# Patient Record
Sex: Female | Born: 1950 | Hispanic: Yes | Marital: Married | Smoking: Never smoker
Health system: Southern US, Community
[De-identification: ages and names within clinical notes are randomized; demographics above are authoritative.]

## PROBLEM LIST (undated history)

## (undated) DIAGNOSIS — I10 Essential (primary) hypertension: Secondary | ICD-10-CM

---

## 2017-09-29 ENCOUNTER — Encounter (HOSPITAL_COMMUNITY): Payer: Self-pay | Admitting: Emergency Medicine

## 2017-09-29 ENCOUNTER — Other Ambulatory Visit: Payer: Self-pay

## 2017-09-29 ENCOUNTER — Emergency Department (HOSPITAL_COMMUNITY)
Admission: EM | Admit: 2017-09-29 | Discharge: 2017-09-29 | Disposition: A | Discharge status: Home / Self Care | Payer: Medicare Other | Attending: Emergency Medicine | Admitting: Emergency Medicine

## 2017-09-29 DIAGNOSIS — L03221 Cellulitis of neck: Secondary | ICD-10-CM | POA: Diagnosis not present

## 2017-09-29 DIAGNOSIS — R21 Rash and other nonspecific skin eruption: Secondary | ICD-10-CM | POA: Diagnosis not present

## 2017-09-29 MED ORDER — DOXYCYCLINE HYCLATE 100 MG PO CAPS: 100.0000 mg | ORAL_CAPSULE | Freq: Two times a day (BID) | ORAL | 0 refills | Status: AC

## 2017-09-29 NOTE — ED Notes (Signed)
ED Provider at bedside. 

## 2017-09-29 NOTE — Discharge Instructions (Signed)
Take doxycycline as prescribed.  You may stop taking the Keflex.  Follow-up with dermatology, see your doctor.

## 2017-09-29 NOTE — ED Triage Notes (Signed)
Translator/pt stated, Im having pain on my neck for a month . I went to a Dr. And they said its shingles and gave me Keflex but I want to have a second opinion.

## 2017-09-29 NOTE — ED Provider Notes (Signed)
[facility] EMERGENCY DEPARTMENT Provider Note   CSN: [id number] Arrival date & time: 09/29/17  1003     History   Chief Complaint No chief complaint on file.   HPI Priscilla Luna is a 67 y.o. female.  67 year old female presents with complaint of rash to both sides of her neck and right ear area.  Interpreter was used for history, physical, discharge planning.  Patient reports rash started 1 month ago on her neck and right ear.  Patient states that she thought she had a wart that she popped and then developed the rash.  Patient went to her PCP last week and was given Keflex and prednisone.  Patient states that Keflex is upsetting her stomach so she is unable to take it.  Patient was told she had shingles however she disagrees with this diagnosis.  Patient states the area is sensitive to the touch otherwise does not itch or bother her.  No other rashes or lesions, no new medications otherwise.  No other complaints or concerns.     History reviewed. No pertinent past medical history.  There are no active problems to display for this patient.   History reviewed. No pertinent surgical history.   OB History   None      Home Medications    Prior to Admission medications   Medication Sig Start Date End Date Taking? Authorizing Provider  doxycycline (VIBRAMYCIN) 100 MG capsule Take 1 capsule (100 mg total) by mouth 2 (two) times daily. 09/29/17   Jeannie Fend, PA-C    Family History No family history on file.  Social History Social History   Tobacco Use  . Smoking status: Never Smoker  . Smokeless tobacco: Never Used  Substance Use Topics  . Alcohol use: Not Currently  . Drug use: Not Currently     Allergies   Patient has no allergy information on record.   Review of Systems Review of Systems  Constitutional: Negative for fever.  Musculoskeletal: Negative for neck pain and neck stiffness.  Skin: Positive for rash.    Allergic/Immunologic: Negative for immunocompromised state.  Neurological: Negative for headaches.  Hematological: Negative for adenopathy. Does not bruise/bleed easily.  Psychiatric/Behavioral: Negative for confusion.  All other systems reviewed and are negative.    Physical Exam Updated Vital Signs BP (!) 171/83 (BP Location: Right Arm)   Pulse 89   Temp 98.1 F (36.7 C) (Oral)   Resp 16   SpO2 98%   Physical Exam  Constitutional: She is oriented to person, place, and time. She appears well-developed and well-nourished. No distress.  HENT:  Head: Normocephalic and atraumatic.    Erythematous patches with raised, scalloped borders to bilateral posterior/lateral neck area. Also involving right posterior ear and scalp. No drainage.   Neck: Neck supple.  Pulmonary/Chest: Effort normal.  Musculoskeletal: She exhibits no tenderness.  Lymphadenopathy:    She has no cervical adenopathy.  Neurological: She is alert and oriented to person, place, and time.  Skin: Skin is warm and dry. Rash noted. She is not diaphoretic.  Psychiatric: She has a normal mood and affect. Her behavior is normal.  Nursing note and vitals reviewed.    ED Treatments / Results  Labs (all labs ordered are listed, but only abnormal results are displayed) Labs Reviewed - No data to display  EKG None  Radiology No results found.  Procedures Procedures (including critical care time)  Medications Ordered in ED Medications - No data to display  Initial Impression / Assessment and Plan / ED Course  I have reviewed the triage vital signs and the nursing notes.  Pertinent labs & imaging results that were available during my care of the patient were reviewed by me and considered in my medical decision making (see chart for details).  Clinical Course as of Sep 29 1129  Mon Sep 29, 2017  [id number] 67 year old female presents with rash to bilateral neck areas as well as right posterior ear onset 1 month  ago.  Patient was seen by her PCP and diagnosed with shingles, given Keflex and prednisone.  Rash does not appear consistent with shingles and its current distribution and now 1 month since onset with its appearance.  Agree with concerns for possible secondary infection, patient is not tolerating the Keflex well recommend she discontinue the Keflex and will place on doxycycline.  Patient requests referral to dermatology, phone number given for her to contact to make an appointment.  Also recommend contact with her PCP if unable to see dermatology.   [LM]    Clinical Course User Index [LM] Jeannie FendMurphy, Navon Kotowski A, PA-C   Final Clinical Impressions(s) / ED Diagnoses   Final diagnoses:  Rash  Cellulitis of neck    ED Discharge Orders        Ordered    doxycycline (VIBRAMYCIN) 100 MG capsule  2 times daily     09/29/17 1119       Alden HippMurphy, Josetta Wigal A, PA-C 09/29/17 1131    Gwyneth SproutPlunkett, Whitney, MD 09/29/17 2038

## 2017-09-29 NOTE — ED Notes (Signed)
Pt verbalized understanding of discharge instructions and denies any further questions at this time.   

## 2017-10-10 ENCOUNTER — Other Ambulatory Visit: Payer: Self-pay

## 2017-10-10 ENCOUNTER — Emergency Department (HOSPITAL_COMMUNITY): Payer: Medicare Other

## 2017-10-10 ENCOUNTER — Emergency Department (HOSPITAL_COMMUNITY)
Admission: EM | Admit: 2017-10-10 | Discharge: 2017-10-11 | Disposition: A | Discharge status: Home / Self Care | Payer: Medicare Other | Attending: Emergency Medicine | Admitting: Emergency Medicine

## 2017-10-10 ENCOUNTER — Encounter (HOSPITAL_COMMUNITY): Payer: Self-pay

## 2017-10-10 DIAGNOSIS — R29810 Facial weakness: Secondary | ICD-10-CM

## 2017-10-10 DIAGNOSIS — I10 Essential (primary) hypertension: Secondary | ICD-10-CM | POA: Diagnosis not present

## 2017-10-10 DIAGNOSIS — G510 Bell's palsy: Secondary | ICD-10-CM | POA: Diagnosis not present

## 2017-10-10 DIAGNOSIS — R531 Weakness: Secondary | ICD-10-CM | POA: Insufficient documentation

## 2017-10-10 HISTORY — DX: Essential (primary) hypertension: I10

## 2017-10-10 LAB — DIFFERENTIAL
BASOS PCT: 1 %
Basophils Absolute: 0.1 10*3/uL (ref 0.0–0.1)
EOS PCT: 5 %
Eosinophils Absolute: 0.3 10*3/uL (ref 0.0–0.7)
Lymphocytes Relative: 31 %
Lymphs Abs: 2.3 10*3/uL (ref 0.7–4.0)
MONO ABS: 0.9 10*3/uL (ref 0.1–1.0)
Monocytes Relative: 12 %
Neutro Abs: 3.8 10*3/uL (ref 1.7–7.7)
Neutrophils Relative %: 51 %

## 2017-10-10 LAB — COMPREHENSIVE METABOLIC PANEL
ALT: 24 U/L (ref 0–44)
ANION GAP: 9 (ref 5–15)
AST: 22 U/L (ref 15–41)
Albumin: 4.1 g/dL (ref 3.5–5.0)
Alkaline Phosphatase: 73 U/L (ref 38–126)
BUN: 14 mg/dL (ref 8–23)
CALCIUM: 9.7 mg/dL (ref 8.9–10.3)
CO2: 27 mmol/L (ref 22–32)
Chloride: 106 mmol/L (ref 98–111)
Creatinine, Ser: 0.78 mg/dL (ref 0.44–1.00)
GFR calc non Af Amer: >60 mL/min (ref >60)
Glucose, Bld: 118 mg/dL — ABNORMAL HIGH (ref 70–99)
Potassium: 4 mmol/L (ref 3.5–5.1)
SODIUM: 142 mmol/L (ref 135–145)
Total Bilirubin: 0.7 mg/dL (ref 0.3–1.2)
Total Protein: 7.6 g/dL (ref 6.5–8.1)

## 2017-10-10 LAB — RAPID URINE DRUG SCREEN, HOSP PERFORMED
Amphetamines: NOT DETECTED
Barbiturates: NOT DETECTED
Benzodiazepines: NOT DETECTED
Cocaine: NOT DETECTED
OPIATES: NOT DETECTED
Tetrahydrocannabinol: NOT DETECTED

## 2017-10-10 LAB — URINALYSIS, ROUTINE W REFLEX MICROSCOPIC
BILIRUBIN URINE: NEGATIVE
Glucose, UA: NEGATIVE mg/dL
Hgb urine dipstick: NEGATIVE
Ketones, ur: NEGATIVE mg/dL
LEUKOCYTES UA: NEGATIVE
NITRITE: NEGATIVE
PH: 6 (ref 5.0–8.0)
Protein, ur: NEGATIVE mg/dL
SPECIFIC GRAVITY, URINE: 1.009 (ref 1.005–1.030)

## 2017-10-10 LAB — I-STAT TROPONIN, ED: Troponin i, poc: 0.01 ng/mL (ref 0.00–0.08)

## 2017-10-10 LAB — I-STAT CHEM 8, ED
BUN: 13 mg/dL (ref 8–23)
CALCIUM ION: 1.17 mmol/L (ref 1.15–1.40)
Chloride: 106 mmol/L (ref 98–111)
Creatinine, Ser: 0.8 mg/dL (ref 0.44–1.00)
GLUCOSE: 113 mg/dL — AB (ref 70–99)
HCT: 41 % (ref 36.0–46.0)
HEMOGLOBIN: 13.9 g/dL (ref 12.0–15.0)
Potassium: 3.9 mmol/L (ref 3.5–5.1)
Sodium: 141 mmol/L (ref 135–145)
TCO2: 26 mmol/L (ref 22–32)

## 2017-10-10 LAB — CBC
HCT: 41 % (ref 36.0–46.0)
Hemoglobin: 13.1 g/dL (ref 12.0–15.0)
MCH: 31.4 pg (ref 26.0–34.0)
MCHC: 32 g/dL (ref 30.0–36.0)
MCV: 98.3 fL (ref 78.0–100.0)
PLATELETS: 261 10*3/uL (ref 150–400)
RBC: 4.17 MIL/uL (ref 3.87–5.11)
RDW: 13 % (ref 11.5–15.5)
WBC: 7.4 10*3/uL (ref 4.0–10.5)

## 2017-10-10 LAB — PROTIME-INR
INR: 1.03
PROTHROMBIN TIME: 13.4 s (ref 11.4–15.2)

## 2017-10-10 LAB — APTT: aPTT: 24 seconds (ref 24–36)

## 2017-10-10 NOTE — ED Notes (Signed)
Report called to [facility] ED Charge RN  

## 2017-10-10 NOTE — ED Notes (Signed)
[facility] picking up patient to take to [facility].

## 2017-10-10 NOTE — ED Triage Notes (Addendum)
Transporter/pt states--face and mouth a little "twisted" Pt reports neck is tense. Denies pain. Pt's son stats the mouth is not normal, it's "twisting to one side." Smile is drooping to on right side, started 3 hours ago. Denies vision changes. Denies chest pain and shortness of breath. Ambulatory. Pt reports hx of HTN, took BP meds today.

## 2017-10-10 NOTE — ED Notes (Signed)
Teleneuro cart activated.  

## 2017-10-10 NOTE — ED Provider Notes (Signed)
Emergency Department Provider Note   I have reviewed the triage vital signs and the nursing notes.   HISTORY  Chief Complaint Code Stroke and Facial Droop   HPI Priscilla Luna is a 67 y.o. female with PMH of HTN and elevated BMI presents to the emergency department for evaluation by private vehicle for right face "twisting" and weakness that began at 7 PM.  The patient's son states that they were having a family meeting when family noticed to the right side weakness.  The patient denies any numbness, vision changes, voice changes.  She does not appreciate any weakness in the arms or legs.  No prior history of stroke.  Symptoms have not improved since 7 PM. Son notes that BP has been higher than normal as well. Patient denies any CP, SOB, or palpitations.    Past Medical History:  Diagnosis Date  . Hypertension     There are no active problems to display for this patient.   History reviewed. No pertinent surgical history.  Current Outpatient Rx  . Order #: [id number] Class: Normal    Allergies Patient has no known allergies.  History reviewed. No pertinent family history.  Social History Social History   Tobacco Use  . Smoking status: Never Smoker  . Smokeless tobacco: Never Used  Substance Use Topics  . Alcohol use: Not Currently  . Drug use: Not Currently    Review of Systems  Constitutional: No fever/chills Eyes: No visual changes. ENT: No sore throat. Cardiovascular: Denies chest pain. Respiratory: Denies shortness of breath. Gastrointestinal: No abdominal pain.  No nausea, no vomiting.  No diarrhea.  No constipation. Genitourinary: Negative for dysuria. Musculoskeletal: Negative for back pain. Skin: Negative for rash. Neurological: Negative for headaches. Positive right sided weakness.   10-point ROS otherwise negative.  ____________________________________________   PHYSICAL EXAM:  VITAL SIGNS: ED Triage Vitals  Enc Vitals Group       BP 10/10/17 2204 (!) 194/129     Pulse Rate 10/10/17 2204 96     Resp 10/10/17 2204 18     Temp 10/10/17 2204 98.5 F (36.9 C)     Temp Source 10/10/17 2204 Oral     SpO2 10/10/17 2204 99 %     Weight 10/10/17 2207 200 lb (90.7 kg)     Height 10/10/17 2207 5\' 5"  (1.651 m)     Pain Score 10/10/17 2207 0   Constitutional: Alert and oriented. Well appearing and in no acute distress. Eyes: Conjunctivae are normal. PERRL. Head: Atraumatic. Nose: No congestion/rhinnorhea. Mouth/Throat: Mucous membranes are moist.  Oropharynx non-erythematous. Neck: No stridor.  Cardiovascular: Normal rate, regular rhythm. Good peripheral circulation. Grossly normal heart sounds.   Respiratory: Normal respiratory effort.  No retractions. Lungs CTAB. Gastrointestinal: Soft and nontender. No distention.  Musculoskeletal: No lower extremity tenderness nor edema. No gross deformities of extremities. Neurologic:  Normal speech and language. Decreased nasolabial fold on the right with decreased ability to close the right eye. No apparent forehead weakness. Slight drift of the RUE but normal grip strength. Normal sensation in the upper extremities. Normal strength/sensation in the lower extremities.  Skin:  Skin is warm, dry and intact. No rash noted.   ____________________________________________   LABS (all labs ordered are listed, but only abnormal results are displayed)  Labs Reviewed  COMPREHENSIVE METABOLIC PANEL - Abnormal; Notable for the following components:      Result Value   Glucose, Bld 118 (*)    All other components within normal limits  I-STAT CHEM 8, ED - Abnormal; Notable for the following components:   Glucose, Bld 113 (*)    All other components within normal limits  PROTIME-INR  APTT  CBC  DIFFERENTIAL  ETHANOL  RAPID URINE DRUG SCREEN, HOSP PERFORMED  URINALYSIS, ROUTINE W REFLEX MICROSCOPIC  I-STAT TROPONIN, ED   ____________________________________________  EKG   EKG  Interpretation  Date/Time:  Friday October 10 2017 22:35:12 EDT Ventricular Rate:  101 PR Interval:    QRS Duration: 85 QT Interval:  341 QTC Calculation: 442 R Axis:   13 Text Interpretation:  Sinus tachycardia LVH by voltage No STEMI Confirmed by Alona Bene [phone]) on 10/10/2017 10:50:42 PM       ____________________________________________  RADIOLOGY  Ct Head Code Stroke Wo Contrast  Result Date: 10/10/2017 CLINICAL DATA:  Code stroke.  RIGHT facial droop for 3 hours. EXAM: CT HEAD WITHOUT CONTRAST TECHNIQUE: Contiguous axial images were obtained from the base of the skull through the vertex without intravenous contrast. COMPARISON:  None. FINDINGS: BRAIN: No intraparenchymal hemorrhage, mass effect nor midline shift. The ventricles and sulci are normal for age, cavum velum interpositum. Patchy supratentorial white matter hypodensities within normal range for patient's age, though non-specific are most compatible with chronic small vessel ischemic disease. Old bilateral basal ganglia lacunar infarcts. No acute large vascular territory infarcts. No abnormal extra-axial fluid collections. Basal cisterns are patent. VASCULAR: Mild calcific atherosclerosis of the carotid siphons. SKULL: No skull fracture. No significant scalp soft tissue swelling. SINUSES/ORBITS: Minimal RIGHT mastoid effusion. Included paranasal sinuses are well aerated.The included ocular globes and orbital contents are non-suspicious. OTHER: None. ASPECTS (Alberta Stroke Program Early CT Score) - Ganglionic level infarction (caudate, lentiform nuclei, internal capsule, insula, M1-M3 cortex): 7 - Supraganglionic infarction (M4-M6 cortex): 3 Total score (0-10 with 10 being normal): 10 IMPRESSION: 1. Multiple chronic appearing basal ganglia infarcts though, acute lacunar infarct not excluded. 2. Mild chronic small vessel ischemic changes. 3. ASPECTS is 10. 4. Critical Value/emergent results were called by telephone at the time of  interpretation on 10/10/2017 at 10:35 pm to Dr. Alona Bene , who verbally acknowledged these results. Electronically Signed   By: Awilda Metro M.D.   On: 10/10/2017 22:36    ____________________________________________   PROCEDURES  Procedure(s) performed:   Procedures  CRITICAL CARE Performed by: Maia Plan Total critical care time: 35 minutes Critical care time was exclusive of separately billable procedures and treating other patients. Critical care was necessary to treat or prevent imminent or life-threatening deterioration. Critical care was time spent personally by me on the following activities: development of treatment plan with patient and/or surrogate as well as nursing, discussions with consultants, evaluation of patient's response to treatment, examination of patient, obtaining history from patient or surrogate, ordering and performing treatments and interventions, ordering and review of laboratory studies, ordering and review of radiographic studies, pulse oximetry and re-evaluation of patient's condition.  Alona Bene, MD Emergency Medicine  ____________________________________________   INITIAL IMPRESSION / ASSESSMENT AND PLAN / ED COURSE  Pertinent labs & imaging results that were available during my care of the patient were reviewed by me and considered in my medical decision making (see chart for details).  Patient presents to the emergency department with right face droop and slight pronator drift in the right upper extremity.  She was last seen normal at 7 PM, approximately 3 hours prior to ED presentation.  She did arrive by private vehicle with family.  She is hypertensive.  Given her arrival within  the tPA window of activated a code stroke from triage.  She will be taken immediately to CT and evaluated by tele-neurology.  No immediate contraindications to tPA on my assessment. Awaiting Neurology recommendations.   Reviewed CT imaging, labs, and EKG. Old  findings on CT. Acute infarct not excluded. No BP control for now.   11:00 PM Spoke with Dr. Thalia Bloodgood after tele-neuro evaluation. He has high suspicion for bell's palsy. Recommends MRI brain only prior to discharge. No further CVA w/u required unless acute infarct on MRI. Discussed with patient and will transfer to [facility] ED for MRI tonight. Spoke with Dr. Adela Lank who accepts the patient in transfer. If discharged would need steroids +/- antivirals. Patient and family updated. Ok with transfer to [facility].  ____________________________________________  FINAL CLINICAL IMPRESSION(S) / ED DIAGNOSES  Final diagnoses:  Weakness on right side of face    Note:  This document was prepared using Dragon voice recognition software and may include unintentional dictation errors.  Alona Bene, MD Emergency Medicine    Consuela Widener, Arlyss Repress, MD 10/10/17 [phone]

## 2017-10-10 NOTE — ED Notes (Signed)
Patient transported to CT 

## 2017-10-10 NOTE — ED Notes (Addendum)
MD arrived on Teleneuro.

## 2017-10-10 NOTE — ED Notes (Signed)
[facility] called for transport. 

## 2017-10-11 ENCOUNTER — Emergency Department (HOSPITAL_COMMUNITY): Payer: Medicare Other

## 2017-10-11 DIAGNOSIS — G510 Bell's palsy: Secondary | ICD-10-CM | POA: Diagnosis not present

## 2017-10-11 MED ORDER — PREDNISONE 50 MG PO TABS: ORAL_TABLET | ORAL | 0 refills | Status: AC

## 2017-10-11 NOTE — ED Notes (Signed)
ED Provider at bedside. 

## 2017-10-11 NOTE — ED Provider Notes (Signed)
Interpreter [phone] Patient updated on plan.  MRI negative.  She was likely has Bell's palsy.  On exam she has difficulty keeping her right eye closed.  She has a mild right facial droop.  Will start prednisone and refer to neurology.  Discussed at length need to protect eye with eye patch at night, as well as eye ointment to keep eye moisture up.  Discussed at length with family and patient.  They agree with plan   Zadie RhineWickline, Claudell Wohler, MD 10/11/17 [phone]

## 2017-10-11 NOTE — ED Notes (Signed)
Patient returned from MRI.

## 2017-10-11 NOTE — ED Notes (Signed)
Patient transported to MRI 

## 2017-10-11 NOTE — ED Provider Notes (Signed)
Presents from outside facility for further imaging.  There is a concern for stroke, but now there is concern for possible Bell's palsy.  She will undergo an MRI here tonight. Patient and family are updated on plan.  She has mild right facial droop, but no other focal neuro deficits.   Priscilla Luna, Elric Tirado, MD 10/11/17 [phone]

## 2017-11-19 ENCOUNTER — Encounter

## 2017-11-19 ENCOUNTER — Ambulatory Visit: Payer: Medicare Other | Admitting: Neurology

## 2019-02-06 IMAGING — CT CT HEAD CODE STROKE
3 series · 15 of 47 positions shown, 18 images · non-contrast
Comparison: None.

CLINICAL DATA: Code stroke.  RIGHT facial droop for 3 hours.

EXAM:
CT HEAD WITHOUT CONTRAST
TECHNIQUE: Contiguous axial images were obtained from the base of the skull
through the vertex without intravenous contrast.

[Series 2: head wo · axial · 0.47mm/px · z∈[+1397,+1527]mm · 9 of 32 slices shown, 12 images]
[im 3/32  brain]
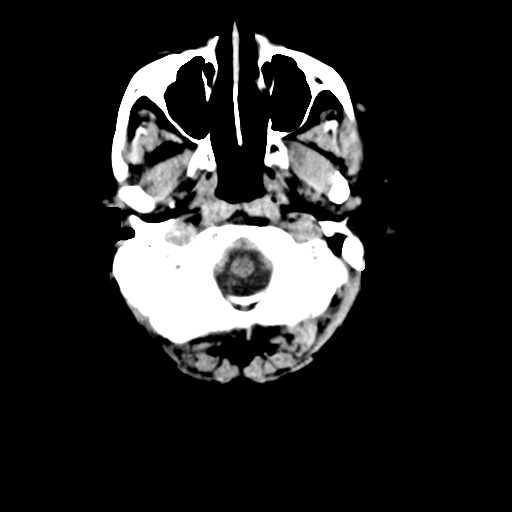
[im 3/32  bone]
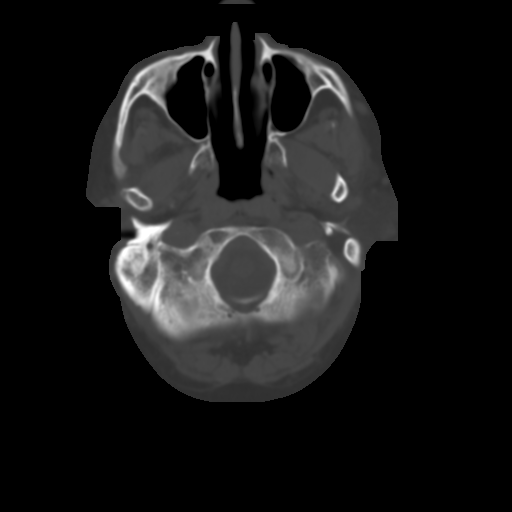
[im 6/32  brain]
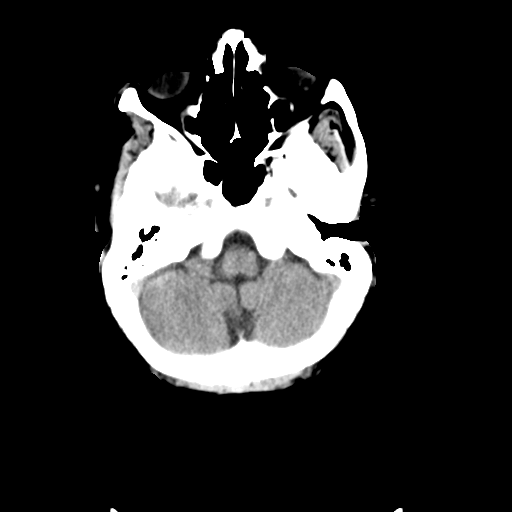
[im 9/32  brain]
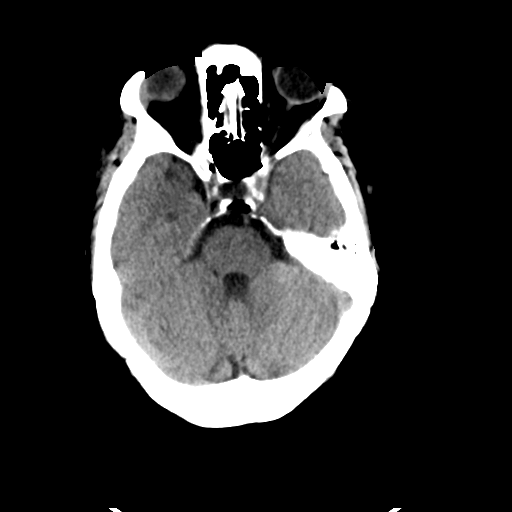
[im 12/32  brain]
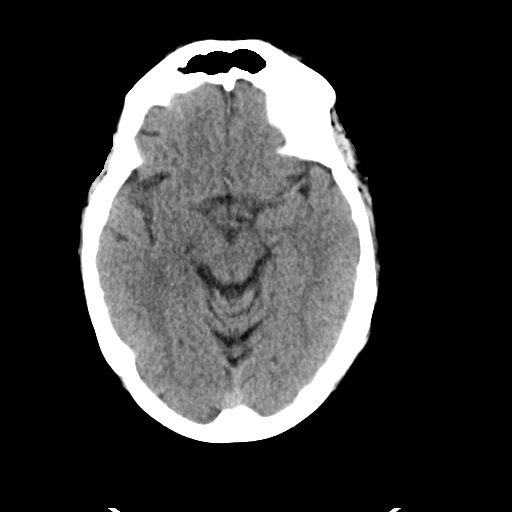
[im 17/32  brain]
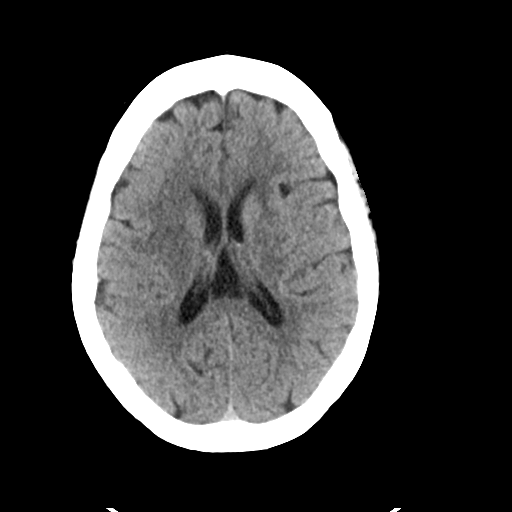
[im 17/32  bone]
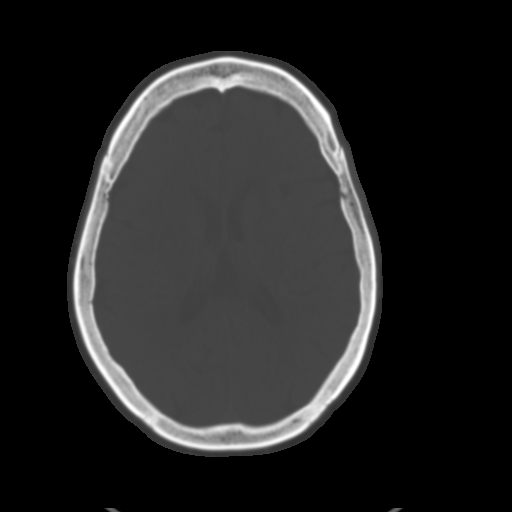
[im 20/32  brain]
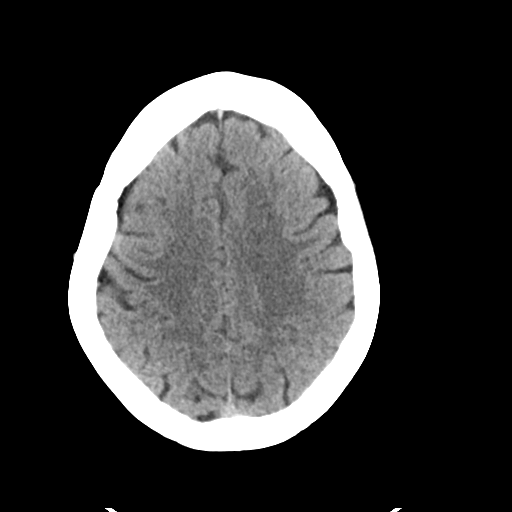
[im 23/32  brain]
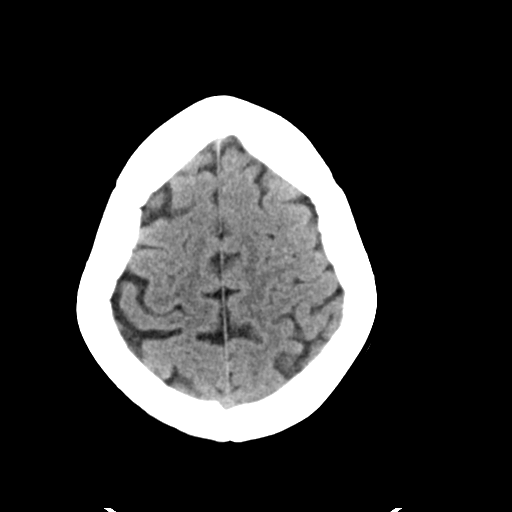
[im 26/32  brain]
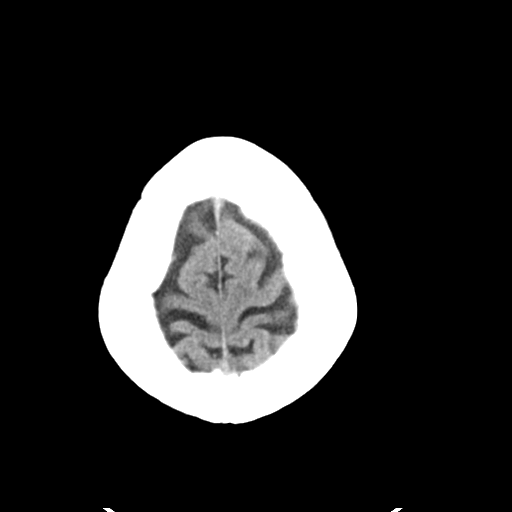
[im 29/32  brain]
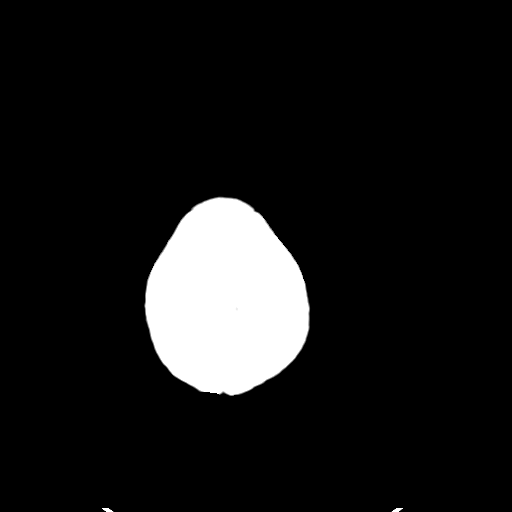
[im 29/32  bone]
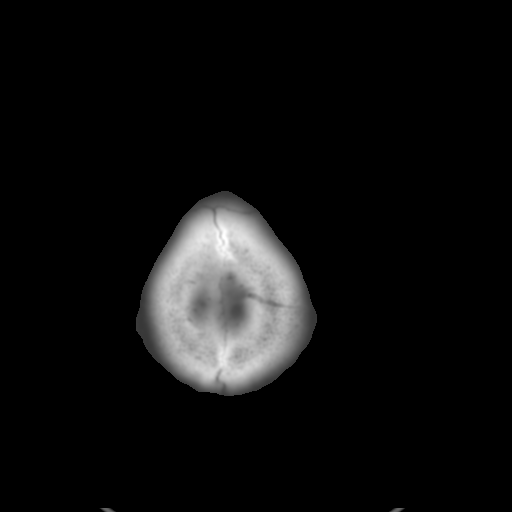

[Series 4: coronal soft tissue · coronal · 0.28mm/px · 3 of 63 slices shown]
[im 21/63  brain]
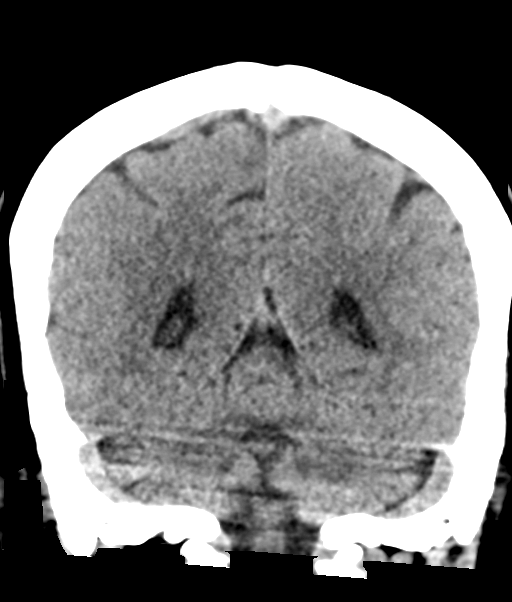
[im 28/63  brain]
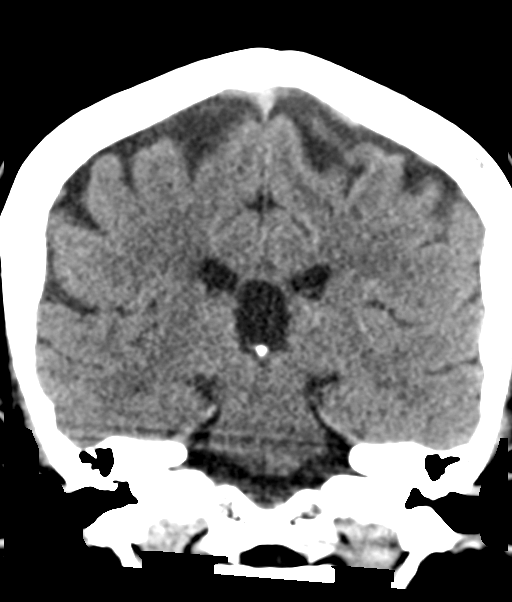
[im 35/63  brain]
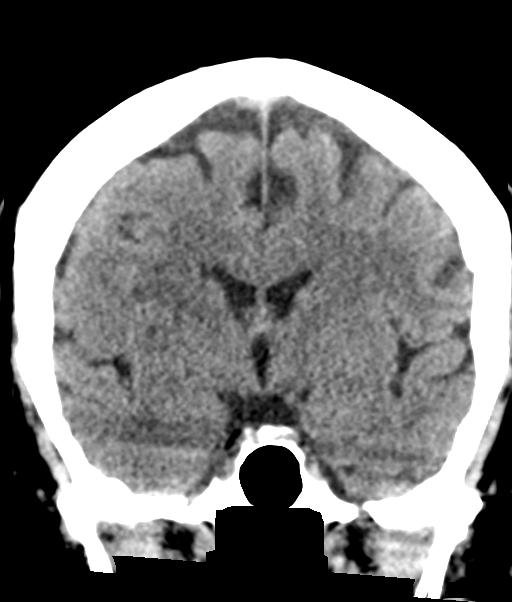

[Series 5: sagittal soft tissue · sagittal · 0.33mm/px · 3 of 48 slices shown]
[im 16/48  brain]
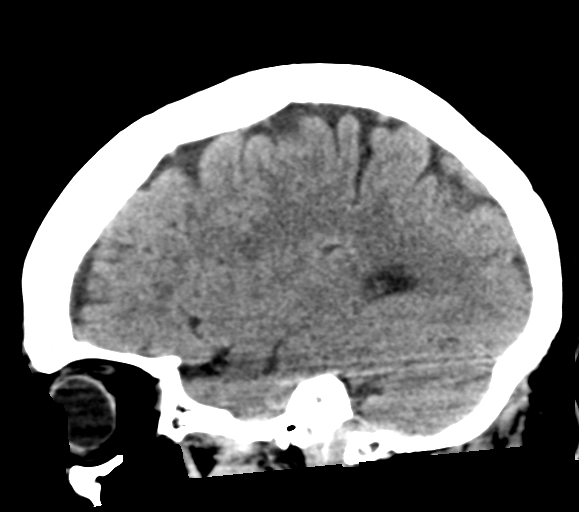
[im 24/48  brain]
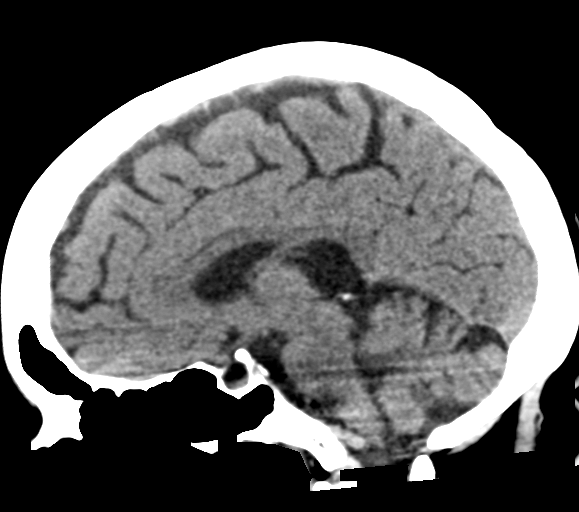
[im 32/48  brain]
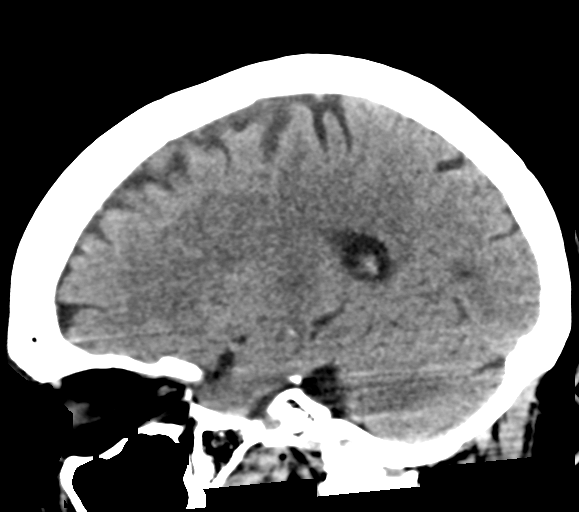

[15 of 47 positions shown; findings below may reference images not displayed]

FINDINGS: BRAIN: No intraparenchymal hemorrhage, mass effect nor midline
shift. The ventricles and sulci are normal for age, cavum velum
interpositum. Patchy supratentorial white matter hypodensities
within normal range for patient's age, though non-specific are most
compatible with chronic small vessel ischemic disease. Old bilateral
basal ganglia lacunar infarcts. No acute large vascular territory
infarcts. No abnormal extra-axial fluid collections. Basal cisterns
are patent.

VASCULAR: Mild calcific atherosclerosis of the carotid siphons.

SKULL: No skull fracture. No significant scalp soft tissue swelling.

SINUSES/ORBITS: Minimal RIGHT mastoid effusion. Included paranasal
sinuses are well aerated.The included ocular globes and orbital
contents are non-suspicious.

OTHER: None.

ASPECTS (Alberta Stroke Program Early CT Score)

- Ganglionic level infarction (caudate, lentiform nuclei, internal
capsule, insula, M1-M3 cortex): 7

- Supraganglionic infarction (M4-M6 cortex): 3

Total score (0-10 with 10 being normal): 10
IMPRESSION: 1. Multiple chronic appearing basal ganglia infarcts though, acute
lacunar infarct not excluded.
2. Mild chronic small vessel ischemic changes.
3. ASPECTS is 10.
4. Critical Value/emergent results were called by telephone at the
time of interpretation on 10/10/2017 at [DATE] to Dr. REMONT TOMILOV ,
who verbally acknowledged these results.
